# Patient Record
Sex: Male | Born: 1970 | Race: White | Hispanic: No | Marital: Single | Smoking: Never smoker
Health system: Southern US, Community
[De-identification: ages and names within clinical notes are randomized; demographics above are authoritative.]

## PROBLEM LIST (undated history)

## (undated) DIAGNOSIS — K219 Gastro-esophageal reflux disease without esophagitis: Secondary | ICD-10-CM

## (undated) DIAGNOSIS — I10 Essential (primary) hypertension: Secondary | ICD-10-CM

## (undated) DIAGNOSIS — Z1612 Extended spectrum beta lactamase (ESBL) resistance: Secondary | ICD-10-CM

## (undated) DIAGNOSIS — K5792 Diverticulitis of intestine, part unspecified, without perforation or abscess without bleeding: Secondary | ICD-10-CM

## (undated) DIAGNOSIS — M109 Gout, unspecified: Secondary | ICD-10-CM

## (undated) DIAGNOSIS — E785 Hyperlipidemia, unspecified: Secondary | ICD-10-CM

## (undated) DIAGNOSIS — A499 Bacterial infection, unspecified: Secondary | ICD-10-CM

## (undated) DIAGNOSIS — E119 Type 2 diabetes mellitus without complications: Secondary | ICD-10-CM

## (undated) HISTORY — PX: KNEE SURGERY: SHX244

---

## 2016-04-15 ENCOUNTER — Inpatient Hospital Stay (HOSPITAL_COMMUNITY)
Admission: AD | Admit: 2016-04-15 | Discharge: 2016-04-21 | DRG: 392 | Disposition: A | Discharge status: Home / Self Care | Payer: BLUE CROSS/BLUE SHIELD | Source: Other Acute Inpatient Hospital | Attending: General Surgery | Admitting: General Surgery

## 2016-04-15 DIAGNOSIS — A047 Enterocolitis due to Clostridium difficile: Secondary | ICD-10-CM | POA: Diagnosis present

## 2016-04-15 DIAGNOSIS — K6389 Other specified diseases of intestine: Secondary | ICD-10-CM

## 2016-04-15 DIAGNOSIS — K219 Gastro-esophageal reflux disease without esophagitis: Secondary | ICD-10-CM | POA: Diagnosis present

## 2016-04-15 DIAGNOSIS — K5720 Diverticulitis of large intestine with perforation and abscess without bleeding: Secondary | ICD-10-CM | POA: Diagnosis present

## 2016-04-15 DIAGNOSIS — Z885 Allergy status to narcotic agent status: Secondary | ICD-10-CM | POA: Diagnosis not present

## 2016-04-15 DIAGNOSIS — R109 Unspecified abdominal pain: Secondary | ICD-10-CM | POA: Diagnosis present

## 2016-04-15 HISTORY — DX: Type 2 diabetes mellitus without complications: E11.9

## 2016-04-15 HISTORY — DX: Extended spectrum beta lactamase (ESBL) resistance: Z16.12

## 2016-04-15 HISTORY — DX: Essential (primary) hypertension: I10

## 2016-04-15 HISTORY — DX: Gastro-esophageal reflux disease without esophagitis: K21.9

## 2016-04-15 HISTORY — DX: Gout, unspecified: M10.9

## 2016-04-15 HISTORY — DX: Hyperlipidemia, unspecified: E78.5

## 2016-04-15 HISTORY — DX: Diverticulitis of intestine, part unspecified, without perforation or abscess without bleeding: K57.92

## 2016-04-15 HISTORY — DX: Bacterial infection, unspecified: A49.9

## 2016-04-15 LAB — GLUCOSE, CAPILLARY: Glucose-Capillary: 100 mg/dL — ABNORMAL HIGH (ref 65–99)

## 2016-04-15 LAB — CBC
HCT: 37.9 % — ABNORMAL LOW (ref 39.0–52.0)
Hemoglobin: 12.4 g/dL — ABNORMAL LOW (ref 13.0–17.0)
MCH: 29.5 pg (ref 26.0–34.0)
MCHC: 32.7 g/dL (ref 30.0–36.0)
MCV: 90.2 fL (ref 78.0–100.0)
Platelets: 303 10*3/uL (ref 150–400)
RBC: 4.2 MIL/uL — ABNORMAL LOW (ref 4.22–5.81)
RDW: 14.2 % (ref 11.5–15.5)
WBC: 19.4 10*3/uL — ABNORMAL HIGH (ref 4.0–10.5)

## 2016-04-15 LAB — BASIC METABOLIC PANEL
Anion gap: 9 (ref 5–15)
BUN: 16 mg/dL (ref 6–20)
CO2: 22 mmol/L (ref 22–32)
Calcium: 9 mg/dL (ref 8.9–10.3)
Chloride: 105 mmol/L (ref 101–111)
Creatinine, Ser: 1.26 mg/dL — ABNORMAL HIGH (ref 0.61–1.24)
GFR calc Af Amer: >60 mL/min (ref >60)
GFR calc non Af Amer: >60 mL/min (ref >60)
Glucose, Bld: 104 mg/dL — ABNORMAL HIGH (ref 65–99)
Potassium: 4.1 mmol/L (ref 3.5–5.1)
Sodium: 136 mmol/L (ref 135–145)

## 2016-04-15 LAB — PROTIME-INR
INR: 1.26
Prothrombin Time: 15.8 seconds — ABNORMAL HIGH (ref 11.4–15.2)

## 2016-04-15 LAB — APTT: APTT: 30 s (ref 24–36)

## 2016-04-15 MED ORDER — METRONIDAZOLE IN NACL 5-0.79 MG/ML-% IV SOLN
500.0000 mg | Freq: Three times a day (TID) | INTRAVENOUS | Status: DC
  Administered 2016-04-15 – 2016-04-19 (×11): 500 mg via INTRAVENOUS
  Filled 2016-04-15 (×13): qty 100

## 2016-04-15 MED ORDER — ONDANSETRON HCL 4 MG/2ML IJ SOLN
4.0000 mg | Freq: Four times a day (QID) | INTRAMUSCULAR | Status: DC | PRN
  Administered 2016-04-17 – 2016-04-18 (×3): 4 mg via INTRAVENOUS
  Filled 2016-04-15 (×3): qty 2

## 2016-04-15 MED ORDER — DIPHENHYDRAMINE HCL 50 MG/ML IJ SOLN: 12.5000 mg | Freq: Four times a day (QID) | INTRAMUSCULAR | Status: DC | PRN

## 2016-04-15 MED ORDER — MORPHINE SULFATE (PF) 2 MG/ML IV SOLN
2.0000 mg | INTRAVENOUS | Status: DC | PRN
  Administered 2016-04-16: 2 mg via INTRAVENOUS
  Filled 2016-04-15: qty 1

## 2016-04-15 MED ORDER — KCL IN DEXTROSE-NACL 20-5-0.45 MEQ/L-%-% IV SOLN
INTRAVENOUS | Status: DC
  Administered 2016-04-15 – 2016-04-20 (×9): via INTRAVENOUS
  Filled 2016-04-15 (×10): qty 1000

## 2016-04-15 MED ORDER — SIMETHICONE 80 MG PO CHEW: 40.0000 mg | CHEWABLE_TABLET | Freq: Four times a day (QID) | ORAL | Status: DC | PRN

## 2016-04-15 MED ORDER — ACETAMINOPHEN 325 MG PO TABS
650.0000 mg | ORAL_TABLET | Freq: Four times a day (QID) | ORAL | Status: DC | PRN
  Administered 2016-04-16 – 2016-04-18 (×4): 650 mg via ORAL
  Filled 2016-04-15 (×4): qty 2

## 2016-04-15 MED ORDER — OXYCODONE HCL 5 MG PO TABS
5.0000 mg | ORAL_TABLET | ORAL | Status: DC | PRN
  Administered 2016-04-16: 10 mg via ORAL
  Filled 2016-04-15: qty 2

## 2016-04-15 MED ORDER — DEXTROSE 5 % IV SOLN
2.0000 g | INTRAVENOUS | Status: DC
  Filled 2016-04-15: qty 2

## 2016-04-15 MED ORDER — ACETAMINOPHEN 650 MG RE SUPP: 650.0000 mg | Freq: Four times a day (QID) | RECTAL | Status: DC | PRN

## 2016-04-15 MED ORDER — ONDANSETRON 4 MG PO TBDP
4.0000 mg | ORAL_TABLET | Freq: Four times a day (QID) | ORAL | Status: DC | PRN
  Administered 2016-04-19: 4 mg via ORAL
  Filled 2016-04-15: qty 1

## 2016-04-15 MED ORDER — ORAL CARE MOUTH RINSE
15.0000 mL | Freq: Two times a day (BID) | OROMUCOSAL | Status: DC
  Administered 2016-04-16 – 2016-04-20 (×7): 15 mL via OROMUCOSAL

## 2016-04-15 MED ORDER — PIPERACILLIN-TAZOBACTAM 3.375 G IVPB
3.3750 g | Freq: Three times a day (TID) | INTRAVENOUS | Status: DC
  Administered 2016-04-15 – 2016-04-19 (×11): 3.375 g via INTRAVENOUS
  Filled 2016-04-15 (×13): qty 50

## 2016-04-15 MED ORDER — DIPHENHYDRAMINE HCL 12.5 MG/5ML PO ELIX: 12.5000 mg | ORAL_SOLUTION | Freq: Four times a day (QID) | ORAL | Status: DC | PRN

## 2016-04-15 NOTE — H&P (Signed)
Norman Reyes is an 45 y.o. male.   Chief Complaint: abdominal pain HPI: 45 yo male with multiple bouts of diverticulitis presented 10 days ago with abdominal pain found to have diverticulitis with 3cm abscess. He was admitted treated with antibiotics, slowly improved, and was discharge, his hospitalization was complicated by c difficile colitis treated with PO vanc and then flagyl. He now has recurrence of pain and nausea.  No past medical history on file.  No past surgical history on file.  No family history on file. Social History:  has no tobacco, alcohol, and drug history on file.  Allergies:  Allergies  Allergen Reactions  . Codeine Other (See Comments)    No prescriptions prior to admission.    Results for orders placed or performed during the hospital encounter of 04/15/16 (from the past 48 hour(s))  Glucose, capillary     Status: Abnormal   Collection Time: 04/15/16  4:40 PM  Result Value Ref Range   Glucose-Capillary 100 (H) 65 - 99 mg/dL   No results found.  Review of Systems  Constitutional: Negative for chills and fever.  HENT: Negative for hearing loss.   Eyes: Negative for blurred vision and double vision.  Respiratory: Negative for cough and hemoptysis.   Cardiovascular: Negative for chest pain and palpitations.  Gastrointestinal: Positive for abdominal pain and nausea. Negative for vomiting.  Genitourinary: Negative for dysuria and urgency.  Musculoskeletal: Negative for myalgias and neck pain.  Skin: Negative for itching and rash.  Neurological: Negative for dizziness, tingling and headaches.  Endo/Heme/Allergies: Does not bruise/bleed easily.  Psychiatric/Behavioral: Negative for depression and suicidal ideas.    Blood pressure 113/76, pulse 98, temperature 98.7 F (37.1 C), temperature source Oral, SpO2 97 %. Physical Exam  Nursing note and vitals reviewed. Constitutional: He is oriented to person, place, and time. He appears well-developed and  well-nourished.  HENT:  Head: Normocephalic and atraumatic.  Eyes: Conjunctivae and EOM are normal. No scleral icterus.  Neck: Normal range of motion. Neck supple.  Cardiovascular: Normal rate and regular rhythm.   Respiratory: Effort normal and breath sounds normal. He has no wheezes. He has no rales. He exhibits no tenderness.  GI: Soft. He exhibits no distension. There is tenderness in the left lower quadrant. There is no rebound.  Musculoskeletal: Normal range of motion. He exhibits no edema.  Neurological: He is alert and oriented to person, place, and time.  Skin: Skin is warm and dry.  Psychiatric: He has a normal mood and affect. His behavior is normal.     OSH labs WBC 18.7 Hgb 13.4 Plt 326  Cr 1.2   Assessment/Plan 45 yo male with recurrent sigmoid diverticulitis with 7cm abscess. He has had multiple bouts in the past, though has never undergone colonoscopy -NPO -IV zosyn/flagyl -IR for perc drain -IV resuscitation -we discussed due to the recurrent nature he likely would benefit from a resection type operation. At this time we will try drainage to get to an elective and single stage operation, though we did discuss the possible need for hartman's procedure  [name], MD 04/15/2016, 4:54 PM

## 2016-04-15 NOTE — Progress Notes (Signed)
Pt admitted to [facility] with family. Dr Sheliah HatchKinsinger texted of pt arrival.

## 2016-04-16 ENCOUNTER — Encounter (HOSPITAL_COMMUNITY): Payer: Self-pay | Admitting: Radiology

## 2016-04-16 ENCOUNTER — Inpatient Hospital Stay (HOSPITAL_COMMUNITY): Payer: BLUE CROSS/BLUE SHIELD

## 2016-04-16 LAB — BASIC METABOLIC PANEL
ANION GAP: 6 (ref 5–15)
BUN: 15 mg/dL (ref 6–20)
CHLORIDE: 104 mmol/L (ref 101–111)
CO2: 26 mmol/L (ref 22–32)
Calcium: 8.6 mg/dL — ABNORMAL LOW (ref 8.9–10.3)
Creatinine, Ser: 1.31 mg/dL — ABNORMAL HIGH (ref 0.61–1.24)
GFR calc Af Amer: >60 mL/min (ref >60)
GFR calc non Af Amer: >60 mL/min (ref >60)
GLUCOSE: 125 mg/dL — AB (ref 65–99)
POTASSIUM: 3.6 mmol/L (ref 3.5–5.1)
Sodium: 136 mmol/L (ref 135–145)

## 2016-04-16 LAB — CBC
HEMATOCRIT: 37.5 % — AB (ref 39.0–52.0)
HEMOGLOBIN: 12 g/dL — AB (ref 13.0–17.0)
MCH: 29.1 pg (ref 26.0–34.0)
MCHC: 32 g/dL (ref 30.0–36.0)
MCV: 91 fL (ref 78.0–100.0)
Platelets: 307 10*3/uL (ref 150–400)
RBC: 4.12 MIL/uL — ABNORMAL LOW (ref 4.22–5.81)
RDW: 14.1 % (ref 11.5–15.5)
WBC: 17.9 10*3/uL — ABNORMAL HIGH (ref 4.0–10.5)

## 2016-04-16 MED ORDER — LIDOCAINE HCL 1 % IJ SOLN
INTRAMUSCULAR | Status: AC
  Filled 2016-04-16: qty 20

## 2016-04-16 MED ORDER — LORAZEPAM 2 MG/ML IJ SOLN
INTRAMUSCULAR | Status: AC | PRN
  Administered 2016-04-16: 1 mg via INTRAVENOUS

## 2016-04-16 MED ORDER — MIDAZOLAM HCL 2 MG/2ML IJ SOLN
INTRAMUSCULAR | Status: AC
  Filled 2016-04-16: qty 2

## 2016-04-16 MED ORDER — FENTANYL CITRATE (PF) 100 MCG/2ML IJ SOLN
INTRAMUSCULAR | Status: AC | PRN
  Administered 2016-04-16 (×3): 50 ug via INTRAVENOUS

## 2016-04-16 MED ORDER — MIDAZOLAM HCL 2 MG/2ML IJ SOLN
INTRAMUSCULAR | Status: AC | PRN
  Administered 2016-04-16 (×2): 1 mg via INTRAVENOUS

## 2016-04-16 MED ORDER — FENTANYL CITRATE (PF) 100 MCG/2ML IJ SOLN
INTRAMUSCULAR | Status: AC
  Filled 2016-04-16: qty 2

## 2016-04-16 NOTE — Progress Notes (Signed)
Feels slightly better this Am, still with pain.   Vitals:   04/16/16 0205 04/16/16 0543  BP: 122/73 (!) 109/56  Pulse: 96 79  Resp: 18 18  Temp: 98.5 F (36.9 C) 97.8 F (36.6 C)   CBC Latest Ref Rng & Units 04/16/2016 04/15/2016  WBC 4.0 - 10.5 K/uL 17.9(H) 19.4(H)  Hemoglobin 13.0 - 17.0 g/dL 12.0(L) 12.4(L)  Hematocrit 39.0 - 52.0 % 37.5(L) 37.9(L)  Platelets 150 - 400 K/uL 307 303   BMP Latest Ref Rng & Units 04/16/2016 04/15/2016  Glucose 65 - 99 mg/dL 161(W125(H) 960(A104(H)  BUN 6 - 20 mg/dL 15 16  Creatinine 5.400.61 - 1.24 mg/dL 9.81(X1.31(H) 9.14(N1.26(H)  Sodium 135 - 145 mmol/L 136 136  Potassium 3.5 - 5.1 mmol/L 3.6 4.1  Chloride 101 - 111 mmol/L 104 105  CO2 22 - 32 mmol/L 26 22  Calcium 8.9 - 10.3 mg/dL 8.2(N8.6(L) 9.0    Intake/Output Summary (Last 24 hours) at 04/16/16 0851 Last data filed at 04/16/16 0600  Gross per 24 hour  Intake             1123 ml  Output              500 ml  Net              623 ml    Aox3, no distress Unlabored respirations RRR Abdomen soft, nondistended, mild LLQ ttp no peritoneal signs ExtWWP  A/P: Diverticulitis with abscess -IR drain today -Continue NPO, ABX, IVF until WBC normalizes and pain resolves

## 2016-04-16 NOTE — Procedures (Signed)
CT placement 46F L pelvic abscess drain 30ml purulent aspirate for GS, C&S No complication No blood loss. See complete dictation in [facility].

## 2016-04-16 NOTE — Sedation Documentation (Signed)
Patient denies pain and is resting comfortably.  

## 2016-04-16 NOTE — Plan of Care (Signed)
Problem: Nutrition: Goal: Adequate nutrition will be maintained Outcome: Progressing Pt on CL diet following drain placement, pt understands.

## 2016-04-16 NOTE — Progress Notes (Signed)
Paged IRadiology through answering service to find out about drain placement.

## 2016-04-16 NOTE — Progress Notes (Signed)
IR consulted for percutaneous drainage of persistent diverticular abscess Chart, imaging, labs, meds reviewed. Amenable to perc drainage. BP 120/75 (BP Location: Left Arm)   Pulse 87   Temp 98.6 F (37 C) (Oral)   Resp 18   SpO2 97%  Lungs: CTA Heart: reg Abd: tender low abd  Diverticulitis with abscess For CT guided perc drainage. Risks and Benefits discussed with the patient including bleeding, infection, damage to adjacent structures, bowel perforation/fistula connection, and sepsis. All of the patient's questions were answered, patient is agreeable to proceed. Consent signed and in chart.  Brayton ElKevin Lamaria Hildebrandt PA-C Interventional Radiology 04/16/2016 11:56 AM

## 2016-04-17 LAB — CBC
HCT: 37 % — ABNORMAL LOW (ref 39.0–52.0)
Hemoglobin: 12 g/dL — ABNORMAL LOW (ref 13.0–17.0)
MCH: 29.3 pg (ref 26.0–34.0)
MCHC: 32.4 g/dL (ref 30.0–36.0)
MCV: 90.5 fL (ref 78.0–100.0)
PLATELETS: 323 10*3/uL (ref 150–400)
RBC: 4.09 MIL/uL — AB (ref 4.22–5.81)
RDW: 13.8 % (ref 11.5–15.5)
WBC: 13.5 10*3/uL — AB (ref 4.0–10.5)

## 2016-04-17 MED ORDER — METHOCARBAMOL 750 MG PO TABS: 750.0000 mg | ORAL_TABLET | Freq: Four times a day (QID) | ORAL | Status: DC | PRN

## 2016-04-17 NOTE — Progress Notes (Signed)
Supervising Physician: Irish Lack  Patient Status:  Inpatient   Subjective: S/p LLQ perc drain 8/27 for diverticular abscess Feeling some nausea this am.  Sore at drain site as expected  Allergies: Codeine  Medications:  Current Facility-Administered Medications:  .  acetaminophen (TYLENOL) tablet 650 mg, 650 mg, Oral, Q6H PRN, 650 mg at 04/17/16 0231 **OR** acetaminophen (TYLENOL) suppository 650 mg, 650 mg, Rectal, Q6H PRN, De Blanch Kinsinger, MD .  dextrose 5 % and 0.45 % NaCl with KCl 20 mEq/L infusion, , Intravenous, Continuous, Rodman Pickle, MD, Last Rate: 75 mL/hr at 04/17/16 0548 .  diphenhydrAMINE (BENADRYL) 12.5 MG/5ML elixir 12.5 mg, 12.5 mg, Oral, Q6H PRN **OR** diphenhydrAMINE (BENADRYL) injection 12.5 mg, 12.5 mg, Intravenous, Q6H PRN, Rodman Pickle, MD .  MEDLINE mouth rinse, 15 mL, Mouth Rinse, BID, Rodman Pickle, MD, 15 mL at 04/17/16 1003 .  [DISCONTINUED] cefTRIAXone (ROCEPHIN) 2 g in dextrose 5 % 50 mL IVPB, 2 g, Intravenous, Q24H **AND** metroNIDAZOLE (FLAGYL) IVPB 500 mg, 500 mg, Intravenous, Q8H, Rodman Pickle, MD, 500 mg at 04/17/16 9604 .  morphine 2 MG/ML injection 2-4 mg, 2-4 mg, Intravenous, Q1H PRN, Rodman Pickle, MD, 2 mg at 04/16/16 2343 .  ondansetron (ZOFRAN-ODT) disintegrating tablet 4 mg, 4 mg, Oral, Q6H PRN **OR** ondansetron (ZOFRAN) injection 4 mg, 4 mg, Intravenous, Q6H PRN, Rodman Pickle, MD, 4 mg at 04/17/16 1001 .  oxyCODONE (Oxy IR/ROXICODONE) immediate release tablet 5-10 mg, 5-10 mg, Oral, Q4H PRN, Rodman Pickle, MD, 10 mg at 04/16/16 1351 .  piperacillin-tazobactam (ZOSYN) IVPB 3.375 g, 3.375 g, Intravenous, Q8H, Joaquim Nam, RPH, 3.375 g at 04/17/16 0831 .  simethicone (MYLICON) chewable tablet 40 mg, 40 mg, Oral, Q6H PRN, Rodman Pickle, MD    Vital Signs: BP (!) 96/57 (BP Location: Right Arm)   Pulse 64   Temp 97.5 F (36.4 C) (Oral)   Resp 16   SpO2 97%    Physical Exam LLQ drain intact, site clean Purulent output.  Imaging: Ct Image Guided Drainage By Percutaneous Catheter  Result Date: 04/16/2016 CLINICAL DATA:  Progression of diverticular abscess. EXAM: CT GUIDED DRAINAGE OF LEFT PELVIC ABSCESS ANESTHESIA/SEDATION: Intravenous Fentanyl and Versed were administered as conscious sedation during continuous monitoring of the patient's level of consciousness and physiological / cardiorespiratory status by the radiology RN, with a total moderate sedation time of 7 minutes. PROCEDURE: The procedure, risks, benefits, and alternatives were explained to the patient. Questions regarding the procedure were encouraged and answered. The patient understands and consents to the procedure. Select axial scans through the pelvis were obtained. The left intraperitoneal abscess was localized and an appropriate skin entry site was determined and marked. The operative field was prepped with chlorhexidinein a sterile fashion, and a sterile drape was applied covering the operative field. A sterile gown and sterile gloves were used for the procedure. Local anesthesia was provided with 1% Lidocaine. Under CT fluoroscopic guidance, an 18 gauge trocar needle was advanced into the collection. Purulent material could be aspirated. An Amplatz guidewire advanced easily within the collection, its position confirmed on CT. Tract dilated to facilitate placement of a 12 French pigtail drain catheter, formed centrally within the collection. 30 mL of purulent aspirate was sent for Gram stain, culture and sensitivity. Catheter secured externally with 0 Prolene suture and StatLock and placed to a gravity drain bag. The patient tolerated the procedure well. COMPLICATIONS: None immediate FINDINGS: Loculated gas and fluid collection in the  left pelvis was again localized. 12French drain catheter placed under CT fluoroscopic guidance. A sample of the aspirate sent for Gram stain and culture.  IMPRESSION: 1. Technically successful CT-guided left pelvic abscess drain catheter placement. Electronically Signed   By: Corlis Leak  Hassell M.D.   On: 04/16/2016 13:20    Labs:  CBC:  Recent Labs  04/15/16 1642 04/16/16 0258  WBC 19.4* 17.9*  HGB 12.4* 12.0*  HCT 37.9* 37.5*  PLT 303 307    COAGS:  Recent Labs  04/15/16 1642  INR 1.26  APTT 30    BMP:  Recent Labs  04/15/16 1642 04/16/16 0258  NA 136 136  K 4.1 3.6  CL 105 104  CO2 22 26  GLUCOSE 104* 125*  BUN 16 15  CALCIUM 9.0 8.6*  CREATININE 1.26* 1.31*  GFRNONAA >60 >60  GFRAA >60 >60    LIVER FUNCTION TESTS: No results for input(s): BILITOT, AST, ALT, ALKPHOS, PROT, ALBUMIN in the last 8760 hours.  Assessment and Plan: LLQ diverticulitis with abscess S/p perc drain Check Cx Drain care/flushes Other plans per [facility]CS  Electronically Signed: Brayton ElBRUNING, Mir Fullilove 04/17/2016, 10:14 AM   I spent a total of 15 Minutes at the the patient's bedside AND on the patient's hospital floor or unit, greater than 50% of which was counseling/coordinating care for LLQ abscess drain

## 2016-04-17 NOTE — Progress Notes (Signed)
[facility] Progress Note     Subjective: Parents in the room. LLQ abdominal soreness after drain placement. Tried taking morphine caused him nausea. Has not had much to drink, does not have an appetite. +BM yesterday that was loose - denies melena, hematochezia, and purulence. Only ambulating to bathroom. Denies fevers/chills.  Drain output ~ 150-200cc/24h   Objective: Vital signs in last 24 hours: Temp:  [97.5 F (36.4 C)-98.8 F (37.1 C)] 97.5 F (36.4 C) (08/28 0606) Pulse Rate:  [64-89] 64 (08/28 0606) Resp:  [13-20] 16 (08/28 0606) BP: (96-131)/(57-83) 96/57 (08/28 0606) SpO2:  [95 %-100 %] 97 % (08/28 0606) Last BM Date: 04/13/16  Intake/Output from previous day: 08/27 0701 - 08/28 0700 In: 2060.4 [P.O.:120; I.V.:1535.4; IV Piggyback:400] Out: 1030 [Urine:900; Drains:130] Intake/Output this shift: Total I/O In: 120 [P.O.:120] Out: -   PE: Gen:  Alert, NAD, pleasant Card:  RRR, no M/G/R  Pulm:  CTA, no W/R/R Abd: Soft, TTP of LLQ, ND, +BS, LLQ drain with 20-30 cc purulent drainage  Lab Results:   Recent Labs  04/15/16 1642 04/16/16 0258  WBC 19.4* 17.9*  HGB 12.4* 12.0*  HCT 37.9* 37.5*  PLT 303 307   BMET  Recent Labs  04/15/16 1642 04/16/16 0258  NA 136 136  K 4.1 3.6  CL 105 104  CO2 22 26  GLUCOSE 104* 125*  BUN 16 15  CREATININE 1.26* 1.31*  CALCIUM 9.0 8.6*   PT/INR  Recent Labs  04/15/16 1642  LABPROT 15.8*  INR 1.26   CMP     Component Value Date/Time   NA 136 04/16/2016 0258   K 3.6 04/16/2016 0258   CL 104 04/16/2016 0258   CO2 26 04/16/2016 0258   GLUCOSE 125 (H) 04/16/2016 0258   BUN 15 04/16/2016 0258   CREATININE 1.31 (H) 04/16/2016 0258   CALCIUM 8.6 (L) 04/16/2016 0258   GFRNONAA >60 04/16/2016 0258   GFRAA >60 04/16/2016 0258   Lipase  No results found for: LIPASE  Studies/Results: Ct Image Guided Drainage By Percutaneous Catheter  Result Date: 04/16/2016 CLINICAL DATA:  Progression of  diverticular abscess. EXAM: CT GUIDED DRAINAGE OF LEFT PELVIC ABSCESS ANESTHESIA/SEDATION: Intravenous Fentanyl and Versed were administered as conscious sedation during continuous monitoring of the patient's level of consciousness and physiological / cardiorespiratory status by the radiology RN, with a total moderate sedation time of 7 minutes. PROCEDURE: The procedure, risks, benefits, and alternatives were explained to the patient. Questions regarding the procedure were encouraged and answered. The patient understands and consents to the procedure. Select axial scans through the pelvis were obtained. The left intraperitoneal abscess was localized and an appropriate skin entry site was determined and marked. The operative field was prepped with chlorhexidinein a sterile fashion, and a sterile drape was applied covering the operative field. A sterile gown and sterile gloves were used for the procedure. Local anesthesia was provided with 1% Lidocaine. Under CT fluoroscopic guidance, an 18 gauge trocar needle was advanced into the collection. Purulent material could be aspirated. An Amplatz guidewire advanced easily within the collection, its position confirmed on CT. Tract dilated to facilitate placement of a 12 French pigtail drain catheter, formed centrally within the collection. 30 mL of purulent aspirate was sent for Gram stain, culture and sensitivity. Catheter secured externally with 0 Prolene suture and StatLock and placed to a gravity drain bag. The patient tolerated the procedure well. COMPLICATIONS: None immediate FINDINGS: Loculated gas and fluid collection in the left pelvis was again  localized. 12French drain catheter placed under CT fluoroscopic guidance. A sample of the aspirate sent for Gram stain and culture. IMPRESSION: 1. Technically successful CT-guided left pelvic abscess drain catheter placement. Electronically Signed   By: Corlis Leak  Hassell M.D.   On: 04/16/2016 13:20    Anti-infectives: Anti-infectives    Start     Dose/Rate Route Frequency Ordered Stop   04/15/16 1800  cefTRIAXone (ROCEPHIN) 2 g in dextrose 5 % 50 mL IVPB  Status:  Discontinued     2 g 100 mL/hr over 30 Minutes Intravenous Every 24 hours 04/15/16 1556 04/15/16 1650   04/15/16 1700  piperacillin-tazobactam (ZOSYN) IVPB 3.375 g     3.375 g 12.5 mL/hr over 240 Minutes Intravenous Every 8 hours 04/15/16 1655     04/15/16 1600  metroNIDAZOLE (FLAGYL) IVPB 500 mg     500 mg 100 mL/hr over 60 Minutes Intravenous Every 8 hours 04/15/16 1556       Assessment/Plan Diverticulitis, recurrent, will abscess formation S/p 12 F L pelvic abscess drain 04/16/16  - 30ml purulent aspirate for GS, C&S (Currently growing few GNR) - WBC 17.9 yesterday, repeat CBC ordered  FEN: clear liquids DVT Proph: ID: Zosyn and Flagyl 8/26 >>  Plan: continue clears, monitor drain OP, PO pain control  Add robaxin    LOS: 2 days    Adam PhenixElizabeth S Monnie Gudgel , [facility] 04/17/2016, 11:54 AM Pager: [phone] Consults: [phone] Mon-Fri 7:00 am-4:30 pm Sat-Sun 7:00 am-11:30 am

## 2016-04-18 MED ORDER — FAMOTIDINE 20 MG PO TABS
20.0000 mg | ORAL_TABLET | Freq: Two times a day (BID) | ORAL | Status: DC
  Administered 2016-04-18 – 2016-04-21 (×7): 20 mg via ORAL
  Filled 2016-04-18 (×8): qty 1

## 2016-04-18 MED ORDER — ENOXAPARIN SODIUM 40 MG/0.4ML ~~LOC~~ SOLN
40.0000 mg | SUBCUTANEOUS | Status: DC
  Administered 2016-04-18 – 2016-04-19 (×2): 40 mg via SUBCUTANEOUS
  Filled 2016-04-18 (×3): qty 0.4

## 2016-04-18 NOTE — Progress Notes (Signed)
[facility] Progress Note     Subjective: NAE overnight. LLQ pain improving but still sore. C/o acid reflux, takes 150mg  zantac daily at home. 3 loose, brown BMs yesterday. Has only been drinking water. Ambulated in his room yesterday, not in the hall.   LLQ drain ~130cc/24h, purulent   Objective: Vital signs in last 24 hours: Temp:  [97.8 F (36.6 C)-98.3 F (36.8 C)] 97.8 F (36.6 C) (08/29 0420) Pulse Rate:  [76-82] 82 (08/29 0420) Resp:  [16-19] 19 (08/29 0420) BP: (112-124)/(74-79) 119/79 (08/29 0420) SpO2:  [99 %] 99 % (08/29 0420) Last BM Date: 04/17/16  Intake/Output from previous day: 08/28 0701 - 08/29 0700 In: 1770 [P.O.:360; I.V.:1255; IV Piggyback:150] Out: 1070 [Urine:1050; Drains:20] Intake/Output this shift: Total I/O In: -  Out: 200 [Urine:200]  PE: Gen:  Alert, NAD, pleasant Card:  RRR, no M/G/R  Pulm:  CTA, no W/R/R Abd: Soft, TTP of LLQ, ND, +BS, LLQ drain with ~10 cc purulent drainage  Lab Results:   Recent Labs  04/16/16 0258 04/17/16 1221  WBC 17.9* 13.5*  HGB 12.0* 12.0*  HCT 37.5* 37.0*  PLT 307 323   BMET  Recent Labs  04/15/16 1642 04/16/16 0258  NA 136 136  K 4.1 3.6  CL 105 104  CO2 22 26  GLUCOSE 104* 125*  BUN 16 15  CREATININE 1.26* 1.31*  CALCIUM 9.0 8.6*   PT/INR  Recent Labs  04/15/16 1642  LABPROT 15.8*  INR 1.26   CMP     Component Value Date/Time   NA 136 04/16/2016 0258   K 3.6 04/16/2016 0258   CL 104 04/16/2016 0258   CO2 26 04/16/2016 0258   GLUCOSE 125 (H) 04/16/2016 0258   BUN 15 04/16/2016 0258   CREATININE 1.31 (H) 04/16/2016 0258   CALCIUM 8.6 (L) 04/16/2016 0258   GFRNONAA >60 04/16/2016 0258   GFRAA >60 04/16/2016 0258   Lipase  No results found for: LIPASE  Studies/Results: Ct Image Guided Drainage By Percutaneous Catheter  Result Date: 04/16/2016 CLINICAL DATA:  Progression of diverticular abscess. EXAM: CT GUIDED DRAINAGE OF LEFT PELVIC ABSCESS  ANESTHESIA/SEDATION: Intravenous Fentanyl and Versed were administered as conscious sedation during continuous monitoring of the patient's level of consciousness and physiological / cardiorespiratory status by the radiology RN, with a total moderate sedation time of 7 minutes. PROCEDURE: The procedure, risks, benefits, and alternatives were explained to the patient. Questions regarding the procedure were encouraged and answered. The patient understands and consents to the procedure. Select axial scans through the pelvis were obtained. The left intraperitoneal abscess was localized and an appropriate skin entry site was determined and marked. The operative field was prepped with chlorhexidinein a sterile fashion, and a sterile drape was applied covering the operative field. A sterile gown and sterile gloves were used for the procedure. Local anesthesia was provided with 1% Lidocaine. Under CT fluoroscopic guidance, an 18 gauge trocar needle was advanced into the collection. Purulent material could be aspirated. An Amplatz guidewire advanced easily within the collection, its position confirmed on CT. Tract dilated to facilitate placement of a 12 French pigtail drain catheter, formed centrally within the collection. 30 mL of purulent aspirate was sent for Gram stain, culture and sensitivity. Catheter secured externally with 0 Prolene suture and StatLock and placed to a gravity drain bag. The patient tolerated the procedure well. COMPLICATIONS: None immediate FINDINGS: Loculated gas and fluid collection in the left pelvis was again localized. 12French drain catheter placed under CT fluoroscopic  guidance. A sample of the aspirate sent for Gram stain and culture. IMPRESSION: 1. Technically successful CT-guided left pelvic abscess drain catheter placement. Electronically Signed   By: Corlis Leak M.D.   On: 04/16/2016 13:20    Anti-infectives: Anti-infectives    Start     Dose/Rate Route Frequency Ordered Stop    04/15/16 1800  cefTRIAXone (ROCEPHIN) 2 g in dextrose 5 % 50 mL IVPB  Status:  Discontinued     2 g 100 mL/hr over 30 Minutes Intravenous Every 24 hours 04/15/16 1556 04/15/16 1650   04/15/16 1700  piperacillin-tazobactam (ZOSYN) IVPB 3.375 g     3.375 g 12.5 mL/hr over 240 Minutes Intravenous Every 8 hours 04/15/16 1655     04/15/16 1600  metroNIDAZOLE (FLAGYL) IVPB 500 mg     500 mg 100 mL/hr over 60 Minutes Intravenous Every 8 hours 04/15/16 1556       Assessment/Plan Diverticulitis, recurrent, will abscess formation S/p 12 F L pelvic abscess drain 04/16/16  - 30ml purulent aspirate for GS, C&S (Currently growing few GNR, rare gram variable rod) - WBC trending down: 13.5 - check BMP and CBC in AM   GERD: Pepcid 20 mg BID   FEN: clear liquids DVT Proph: start lovenox, SCD's ID: Zosyn and Flagyl 8/26 >>  Plan: continue clears, monitor drain OP, PO pain control   Start oral abx in 24-48h   LOS: 3 days    Adam Phenix , [facility] 04/18/2016, 8:00 AM Pager: [phone] Consults: [phone] Mon-Fri 7:00 am-4:30 pm Sat-Sun 7:00 am-11:30 am

## 2016-04-18 NOTE — Progress Notes (Signed)
Patient ID: Norman HubertMarcus Arko, male   DOB: 03/25/1971, 45 y.o.   MRN: [id number]    Referring Physician(s): Dr. Feliciana RossettiLuke Kinsinger  Supervising Physician: Simonne ComeWatts, John  Patient Status: inpt  Chief Complaint: Diverticular abscess  Subjective: Patient feeling ok today.  Tolerating some clear liquids.  Less sore around drain site today.  Allergies: Codeine  Medications: Prior to Admission medications   Medication Sig Start Date End Date Taking? Authorizing Provider  acetaminophen (TYLENOL) 500 MG tablet Take 500 mg by mouth every 8 (eight) hours as needed (pain).   Yes Historical Provider, MD  allopurinol (ZYLOPRIM) 300 MG tablet Take 300 mg by mouth at bedtime.   Yes Historical Provider, MD  ibuprofen (ADVIL,MOTRIN) 200 MG tablet Take 600 mg by mouth every 8 (eight) hours as needed (pain).   Yes Historical Provider, MD  levofloxacin (LEVAQUIN) 750 MG tablet Take 750 mg by mouth every evening. 10 day course started 04/13/16   Yes Historical Provider, MD  losartan (COZAAR) 100 MG tablet Take 100 mg by mouth daily.   Yes Historical Provider, MD  metFORMIN (GLUCOPHAGE-XR) 500 MG 24 hr tablet Take 500 mg by mouth 2 (two) times daily.   Yes Historical Provider, MD  metroNIDAZOLE (FLAGYL) 500 MG tablet Take 500 mg by mouth 3 (three) times daily. 10 day course started 04/12/16   Yes Historical Provider, MD  ranitidine (ZANTAC) 150 MG tablet Take 150 mg by mouth daily.   Yes Historical Provider, MD  simvastatin (ZOCOR) 40 MG tablet Take 40 mg by mouth at bedtime.   Yes Historical Provider, MD    Vital Signs: BP 119/79 (BP Location: Left Arm)   Pulse 82   Temp 97.8 F (36.6 C) (Oral)   Resp 19   SpO2 99%   Physical Exam: Abd: soft, mildly tender in LLQ, drain with tan purulent output, 20cc/24hrs. Drain site is c/d/i  Imaging: Ct Image Guided Drainage By Percutaneous Catheter  Result Date: 04/16/2016 CLINICAL DATA:  Progression of diverticular abscess. EXAM: CT GUIDED DRAINAGE OF LEFT PELVIC  ABSCESS ANESTHESIA/SEDATION: Intravenous Fentanyl and Versed were administered as conscious sedation during continuous monitoring of the patient's level of consciousness and physiological / cardiorespiratory status by the radiology RN, with a total moderate sedation time of 7 minutes. PROCEDURE: The procedure, risks, benefits, and alternatives were explained to the patient. Questions regarding the procedure were encouraged and answered. The patient understands and consents to the procedure. Select axial scans through the pelvis were obtained. The left intraperitoneal abscess was localized and an appropriate skin entry site was determined and marked. The operative field was prepped with chlorhexidinein a sterile fashion, and a sterile drape was applied covering the operative field. A sterile gown and sterile gloves were used for the procedure. Local anesthesia was provided with 1% Lidocaine. Under CT fluoroscopic guidance, an 18 gauge trocar needle was advanced into the collection. Purulent material could be aspirated. An Amplatz guidewire advanced easily within the collection, its position confirmed on CT. Tract dilated to facilitate placement of a 12 French pigtail drain catheter, formed centrally within the collection. 30 mL of purulent aspirate was sent for Gram stain, culture and sensitivity. Catheter secured externally with 0 Prolene suture and StatLock and placed to a gravity drain bag. The patient tolerated the procedure well. COMPLICATIONS: None immediate FINDINGS: Loculated gas and fluid collection in the left pelvis was again localized. 12French drain catheter placed under CT fluoroscopic guidance. A sample of the aspirate sent for Gram stain and culture. IMPRESSION: 1. Technically successful  CT-guided left pelvic abscess drain catheter placement. Electronically Signed   By: Corlis Leak M.D.   On: 04/16/2016 13:20    Labs:  CBC:  Recent Labs  04/15/16 1642 04/16/16 0258 04/17/16 1221  WBC 19.4*  17.9* 13.5*  HGB 12.4* 12.0* 12.0*  HCT 37.9* 37.5* 37.0*  PLT 303 307 323    COAGS:  Recent Labs  04/15/16 1642  INR 1.26  APTT 30    BMP:  Recent Labs  04/15/16 1642 04/16/16 0258  NA 136 136  K 4.1 3.6  CL 105 104  CO2 22 26  GLUCOSE 104* 125*  BUN 16 15  CALCIUM 9.0 8.6*  CREATININE 1.26* 1.31*  GFRNONAA >60 >60  GFRAA >60 >60    LIVER FUNCTION TESTS: No results for input(s): BILITOT, AST, ALT, ALKPHOS, PROT, ALBUMIN in the last 8760 hours.  Assessment and Plan: 1. Diverticular abscess, s/p perc drain on 8/27, [place] -cont to follow -cont routine drain care -repeat CT when output under 10-15cc a day.  If he goes home with his drain, we will arrange for drain clinic follow up.  Electronically Signed: Letha Cape 04/18/2016, 9:56 AM   I spent a total of 15 Minutes at the the patient's bedside AND on the patient's hospital floor or unit, greater than 50% of which was counseling/coordinating care for diverticular abscess

## 2016-04-19 LAB — BASIC METABOLIC PANEL
ANION GAP: 11 (ref 5–15)
BUN: 7 mg/dL (ref 6–20)
CALCIUM: 8.7 mg/dL — AB (ref 8.9–10.3)
CO2: 23 mmol/L (ref 22–32)
Chloride: 103 mmol/L (ref 101–111)
Creatinine, Ser: 0.94 mg/dL (ref 0.61–1.24)
GFR calc Af Amer: >60 mL/min (ref >60)
GFR calc non Af Amer: >60 mL/min (ref >60)
Glucose, Bld: 102 mg/dL — ABNORMAL HIGH (ref 65–99)
POTASSIUM: 4 mmol/L (ref 3.5–5.1)
Sodium: 137 mmol/L (ref 135–145)

## 2016-04-19 LAB — CBC
HEMATOCRIT: 38.4 % — AB (ref 39.0–52.0)
Hemoglobin: 12.6 g/dL — ABNORMAL LOW (ref 13.0–17.0)
MCH: 29.2 pg (ref 26.0–34.0)
MCHC: 32.8 g/dL (ref 30.0–36.0)
MCV: 88.9 fL (ref 78.0–100.0)
PLATELETS: 351 10*3/uL (ref 150–400)
RBC: 4.32 MIL/uL (ref 4.22–5.81)
RDW: 13.8 % (ref 11.5–15.5)
WBC: 11.6 10*3/uL — AB (ref 4.0–10.5)

## 2016-04-19 MED ORDER — CIPROFLOXACIN HCL 500 MG PO TABS
500.0000 mg | ORAL_TABLET | Freq: Two times a day (BID) | ORAL | Status: DC
  Administered 2016-04-19: 500 mg via ORAL
  Filled 2016-04-19: qty 1

## 2016-04-19 MED ORDER — SODIUM CHLORIDE 0.9 % IV SOLN
1.0000 g | INTRAVENOUS | Status: DC
  Administered 2016-04-19 – 2016-04-21 (×3): 1 g via INTRAVENOUS
  Filled 2016-04-19 (×3): qty 1

## 2016-04-19 MED ORDER — METRONIDAZOLE 500 MG PO TABS
500.0000 mg | ORAL_TABLET | Freq: Three times a day (TID) | ORAL | Status: DC
  Administered 2016-04-19: 500 mg via ORAL
  Filled 2016-04-19: qty 1

## 2016-04-19 NOTE — Progress Notes (Signed)
Supervising Physician: Dr. Katherina RightJay Watts  Patient Status:  Inpatient   Subjective: S/p LLQ perc drain 8/27 for diverticular abscess Feeling better daily Soreness at drain site sas improved  Allergies: Codeine  Medications:  Current Facility-Administered Medications:  .  acetaminophen (TYLENOL) tablet 650 mg, 650 mg, Oral, Q6H PRN, 650 mg at 04/18/16 0523 **OR** acetaminophen (TYLENOL) suppository 650 mg, 650 mg, Rectal, Q6H PRN, De BlanchLuke Aaron Kinsinger, MD .  dextrose 5 % and 0.45 % NaCl with KCl 20 mEq/L infusion, , Intravenous, Continuous, Rodman PickleLuke Aaron Kinsinger, MD, Last Rate: 75 mL/hr at 04/18/16 2222 .  diphenhydrAMINE (BENADRYL) 12.5 MG/5ML elixir 12.5 mg, 12.5 mg, Oral, Q6H PRN **OR** diphenhydrAMINE (BENADRYL) injection 12.5 mg, 12.5 mg, Intravenous, Q6H PRN, De BlanchLuke Aaron Kinsinger, MD .  enoxaparin (LOVENOX) injection 40 mg, 40 mg, Subcutaneous, Q24H, Elizabeth S Simaan, PA-C, 40 mg at 04/19/16 [id number] .  ertapenem (INVANZ) 1 g in sodium chloride 0.9 % 50 mL IVPB, 1 g, Intravenous, Q24H, Elizabeth S Simaan, PA-C .  famotidine (PEPCID) tablet 20 mg, 20 mg, Oral, BID, Emelia LoronMatthew Wakefield, MD, 20 mg at 04/19/16 [phone] .  MEDLINE mouth rinse, 15 mL, Mouth Rinse, BID, De BlanchLuke Aaron Kinsinger, MD, 15 mL at 04/19/16 1000 .  methocarbamol (ROBAXIN) tablet 750 mg, 750 mg, Oral, Q6H PRN, Francine GravenElizabeth S Simaan, PA-C .  morphine 2 MG/ML injection 2-4 mg, 2-4 mg, Intravenous, Q1H PRN, Rodman PickleLuke Aaron Kinsinger, MD, 2 mg at 04/16/16 2343 .  ondansetron (ZOFRAN-ODT) disintegrating tablet 4 mg, 4 mg, Oral, Q6H PRN **OR** ondansetron (ZOFRAN) injection 4 mg, 4 mg, Intravenous, Q6H PRN, Rodman PickleLuke Aaron Kinsinger, MD, 4 mg at 04/18/16 1859 .  oxyCODONE (Oxy IR/ROXICODONE) immediate release tablet 5-10 mg, 5-10 mg, Oral, Q4H PRN, Rodman PickleLuke Aaron Kinsinger, MD, 10 mg at 04/16/16 1351 .  simethicone (MYLICON) chewable tablet 40 mg, 40 mg, Oral, Q6H PRN, Rodman PickleLuke Aaron Kinsinger, MD    Vital Signs: BP 122/87 (BP Location: Left Arm)    Pulse 81   Temp 98.2 F (36.8 C) (Oral)   Resp 19   SpO2 100%   Physical Exam LLQ drain intact, site clean Output more serous with some debris/stranding  Imaging: Ct Image Guided Drainage By Percutaneous Catheter  Result Date: 04/16/2016 CLINICAL DATA:  Progression of diverticular abscess. EXAM: CT GUIDED DRAINAGE OF LEFT PELVIC ABSCESS ANESTHESIA/SEDATION: Intravenous Fentanyl and Versed were administered as conscious sedation during continuous monitoring of the patient's level of consciousness and physiological / cardiorespiratory status by the radiology RN, with a total moderate sedation time of 7 minutes. PROCEDURE: The procedure, risks, benefits, and alternatives were explained to the patient. Questions regarding the procedure were encouraged and answered. The patient understands and consents to the procedure. Select axial scans through the pelvis were obtained. The left intraperitoneal abscess was localized and an appropriate skin entry site was determined and marked. The operative field was prepped with chlorhexidinein a sterile fashion, and a sterile drape was applied covering the operative field. A sterile gown and sterile gloves were used for the procedure. Local anesthesia was provided with 1% Lidocaine. Under CT fluoroscopic guidance, an 18 gauge trocar needle was advanced into the collection. Purulent material could be aspirated. An Amplatz guidewire advanced easily within the collection, its position confirmed on CT. Tract dilated to facilitate placement of a 12 French pigtail drain catheter, formed centrally within the collection. 30 mL of purulent aspirate was sent for Gram stain, culture and sensitivity. Catheter secured externally with 0 Prolene suture and StatLock and placed to  a gravity drain bag. The patient tolerated the procedure well. COMPLICATIONS: None immediate FINDINGS: Loculated gas and fluid collection in the left pelvis was again localized. 12French drain catheter placed  under CT fluoroscopic guidance. A sample of the aspirate sent for Gram stain and culture. IMPRESSION: 1. Technically successful CT-guided left pelvic abscess drain catheter placement. Electronically Signed   By: Corlis Leak M.D.   On: 04/16/2016 13:20    Labs:  CBC:  Recent Labs  04/15/16 1642 04/16/16 0258 04/17/16 1221 04/19/16 0507  WBC 19.4* 17.9* 13.5* 11.6*  HGB 12.4* 12.0* 12.0* 12.6*  HCT 37.9* 37.5* 37.0* 38.4*  PLT 303 307 323 351    COAGS:  Recent Labs  04/15/16 1642  INR 1.26  APTT 30    BMP:  Recent Labs  04/15/16 1642 04/16/16 0258 04/19/16 0507  NA 136 136 137  K 4.1 3.6 4.0  CL 105 104 103  CO2 22 26 23   GLUCOSE 104* 125* 102*  BUN 16 15 7   CALCIUM 9.0 8.6* 8.7*  CREATININE 1.26* 1.31* 0.94  GFRNONAA >60 >60 >60  GFRAA >60 >60 >60    Assessment and Plan: LLQ diverticulitis with abscess S/p perc drain Feeling better, WBC slowly coming down Cx= ESBL E Coli  Drain care/flushes Discussed output f/u plans for CT and drain injection, we will arrange.  Electronically Signed: Brayton El 04/19/2016, 11:29 AM   I spent a total of 15 Minutes at the the patient's bedside AND on the patient's hospital floor or unit, greater than 50% of which was counseling/coordinating care for LLQ abscess drain

## 2016-04-19 NOTE — Progress Notes (Signed)
Pharmacy Antibiotic Note - CRITICAL RESULT  Patient with ESBL E Coli from abscess cx. Spoke with Hosie SpangleElizabeth Simaan of Surgery to change to ertapenem  Isaac BlissMichael Palak Tercero, PharmD, BCPS, [facility] Center[facility]CCCP Clinical Pharmacist Pager [phone] 04/19/2016 10:05 AM

## 2016-04-19 NOTE — Progress Notes (Signed)
[facility] Progress Note     Subjective: Pain improving. Tolerating clears without nausea, vomiting, or increased pain. 3 loose BMs daily. Ambulating in halls multiple times daily.  Drain ~ 25 cc/24h Objective: Vital signs in last 24 hours: Temp:  [98 F (36.7 C)-98.9 F (37.2 C)] 98.2 F (36.8 C) (08/30 0459) Pulse Rate:  [79-81] 81 (08/30 0459) Resp:  [17-19] 19 (08/30 0459) BP: (122-135)/(83-90) 122/87 (08/30 0459) SpO2:  [100 %] 100 % (08/30 0459) Last BM Date: 04/17/16  Intake/Output from previous day: 08/29 0701 - 08/30 0700 In: 2288.8 [P.O.:120; I.V.:2003.8; IV Piggyback:150] Out: 1225 [Urine:1200; Drains:25] Intake/Output this shift: No intake/output data recorded.  PE: Gen:  Alert, NAD, pleasant Card:  RRR, no M/G/R  Abd: Soft, TTP LLQ (improving), ND, +BS, drain in place with clean dressing, purulent output  Lab Results:   Recent Labs  04/17/16 1221 04/19/16 0507  WBC 13.5* 11.6*  HGB 12.0* 12.6*  HCT 37.0* 38.4*  PLT 323 351   BMET  Recent Labs  04/19/16 0507  NA 137  K 4.0  CL 103  CO2 23  GLUCOSE 102*  BUN 7  CREATININE 0.94  CALCIUM 8.7*   PT/INR No results for input(s): LABPROT, INR in the last 72 hours. CMP     Component Value Date/Time   NA 137 04/19/2016 0507   K 4.0 04/19/2016 0507   CL 103 04/19/2016 0507   CO2 23 04/19/2016 0507   GLUCOSE 102 (H) 04/19/2016 0507   BUN 7 04/19/2016 0507   CREATININE 0.94 04/19/2016 0507   CALCIUM 8.7 (L) 04/19/2016 0507   GFRNONAA >60 04/19/2016 0507   GFRAA >60 04/19/2016 0507   Lipase  No results found for: LIPASE     Studies/Results: No results found.  Anti-infectives: Anti-infectives    Start     Dose/Rate Route Frequency Ordered Stop   04/15/16 1800  cefTRIAXone (ROCEPHIN) 2 g in dextrose 5 % 50 mL IVPB  Status:  Discontinued     2 g 100 mL/hr over 30 Minutes Intravenous Every 24 hours 04/15/16 1556 04/15/16 1650   04/15/16 1700  piperacillin-tazobactam  (ZOSYN) IVPB 3.375 g     3.375 g 12.5 mL/hr over 240 Minutes Intravenous Every 8 hours 04/15/16 1655     04/15/16 1600  metroNIDAZOLE (FLAGYL) IVPB 500 mg     500 mg 100 mL/hr over 60 Minutes Intravenous Every 8 hours 04/15/16 1556       Assessment/Plan Diverticulitis, recurrent, will abscess formation S/p 12 F L pelvic abscess drain 04/16/16  - 30ml purulent aspirate for GS, C&S (Currently growing few GNR, rare gram variable rod) - WBC trending down: 11.6  GERD: Pepcid 20 mg BID   FEN: advance to full liquid diet DVT Proph: start lovenox, SCD's ID: Zosyn and Flagyl 8/26 >>  Plan:advance diet to full liquids Start PO cipro/flagyl Will need drain clinic follow-up at discharge.   LOS: 4 days    Adam PhenixElizabeth S Ulysee Fyock , [facility] 04/19/2016, 8:05 AM Pager: [phone] Consults: [phone] Mon-Fri 7:00 am-4:30 pm Sat-Sun 7:00 am-11:30 am

## 2016-04-20 HISTORY — PX: PERIPHERALLY INSERTED CENTRAL CATHETER INSERTION: SHX2221

## 2016-04-20 HISTORY — PX: ABCESS DRAINAGE: SHX399

## 2016-04-20 LAB — CBC
HCT: 38.8 % — ABNORMAL LOW (ref 39.0–52.0)
Hemoglobin: 12.8 g/dL — ABNORMAL LOW (ref 13.0–17.0)
MCH: 29.3 pg (ref 26.0–34.0)
MCHC: 33 g/dL (ref 30.0–36.0)
MCV: 88.8 fL (ref 78.0–100.0)
PLATELETS: 347 10*3/uL (ref 150–400)
RBC: 4.37 MIL/uL (ref 4.22–5.81)
RDW: 14 % (ref 11.5–15.5)
WBC: 11.6 10*3/uL — AB (ref 4.0–10.5)

## 2016-04-20 MED ORDER — SODIUM CHLORIDE 0.9% FLUSH: 10.0000 mL | Freq: Two times a day (BID) | INTRAVENOUS | Status: DC

## 2016-04-20 MED ORDER — SODIUM CHLORIDE 0.9% FLUSH
10.0000 mL | INTRAVENOUS | Status: DC | PRN
  Administered 2016-04-21: 10 mL
  Filled 2016-04-20: qty 40

## 2016-04-20 NOTE — Progress Notes (Signed)
[facility]  Patient Status: New pt for [facility] this admission  [facility] is providing the following services: HHRN and Home Infusion Pharmacy services for home IV ABX.  [facility] hospital infusion coordinator will provide hands on teaching prior to DC to support independence at home with IV ABX administration.  [facility] hospital team will follow Dr. Colin Rhein until DC to ensure [facility] and infusion needs are met.  If patient discharges after hours, please call [phone].   Norman Reyes 04/20/2016, 10:49 AM

## 2016-04-20 NOTE — Progress Notes (Signed)
[facility] Progress Note     Subjective: Tolerating full liquids without pain or nausea. Does have early satiety. Ambulating. ambdominal pain improving. Loose brown BMs. +flatus.   Objective: Vital signs in last 24 hours: Temp:  [98.4 F (36.9 C)-98.6 F (37 C)] 98.6 F (37 C) (08/31 0554) Pulse Rate:  [75-84] 75 (08/31 0554) Resp:  [18] 18 (08/31 0554) BP: (113-125)/(74-84) 115/74 (08/31 0554) SpO2:  [98 %-99 %] 99 % (08/31 0554) Last BM Date: 04/19/16  Intake/Output from previous day: 08/30 0701 - 08/31 0700 In: 1968.8 [P.O.:240; I.V.:1723.8] Out: 1710 [Urine:1700; Drains:10] Intake/Output this shift: No intake/output data recorded.  PE: Gen:  Alert, NAD, pleasant Card:  RRR, no M/G/R heard Pulm:  CTA, no W/R/R Abd: Soft, mildy TTP LLQ, ND, +BS, LLQ with clean dressing in place, minimal output  Lab Results:   Recent Labs  04/17/16 1221 04/19/16 0507  WBC 13.5* 11.6*  HGB 12.0* 12.6*  HCT 37.0* 38.4*  PLT 323 351   BMET  Recent Labs  04/19/16 0507  NA 137  K 4.0  CL 103  CO2 23  GLUCOSE 102*  BUN 7  CREATININE 0.94  CALCIUM 8.7*   PT/INR No results for input(s): LABPROT, INR in the last 72 hours. CMP     Component Value Date/Time   NA 137 04/19/2016 0507   K 4.0 04/19/2016 0507   CL 103 04/19/2016 0507   CO2 23 04/19/2016 0507   GLUCOSE 102 (H) 04/19/2016 0507   BUN 7 04/19/2016 0507   CREATININE 0.94 04/19/2016 0507   CALCIUM 8.7 (L) 04/19/2016 0507   GFRNONAA >60 04/19/2016 0507   GFRAA >60 04/19/2016 0507   Lipase  No results found for: LIPASE  Studies/Results: No results found.  Anti-infectives: Anti-infectives    Start     Dose/Rate Route Frequency Ordered Stop   04/19/16 1100  ertapenem (INVANZ) 1 g in sodium chloride 0.9 % 50 mL IVPB     1 g 100 mL/hr over 30 Minutes Intravenous Every 24 hours 04/19/16 1004     04/19/16 0830  ciprofloxacin (CIPRO) tablet 500 mg  Status:  Discontinued     500 mg Oral 2 times  daily 04/19/16 0821 04/19/16 1004   04/19/16 0830  metroNIDAZOLE (FLAGYL) tablet 500 mg  Status:  Discontinued     500 mg Oral Every 8 hours 04/19/16 0821 04/19/16 1004   04/15/16 1800  cefTRIAXone (ROCEPHIN) 2 g in dextrose 5 % 50 mL IVPB  Status:  Discontinued     2 g 100 mL/hr over 30 Minutes Intravenous Every 24 hours 04/15/16 1556 04/15/16 1650   04/15/16 1700  piperacillin-tazobactam (ZOSYN) IVPB 3.375 g  Status:  Discontinued     3.375 g 12.5 mL/hr over 240 Minutes Intravenous Every 8 hours 04/15/16 1655 04/19/16 0821   04/15/16 1600  metroNIDAZOLE (FLAGYL) IVPB 500 mg  Status:  Discontinued     500 mg 100 mL/hr over 60 Minutes Intravenous Every 8 hours 04/15/16 1556 04/19/16 [id number]     Assessment/Plan  Patient with ESBL E Coli from abscess  Diverticulitis, recurrent, will abscess formation S/p 12 F L pelvic abscess drain 04/16/16  - 30ml purulent aspirate for GS, C&S (positive for ESBL E Coli) - ID consult: ESBL E Coli resistant to everything except ertapenem/imipenem/pip-tazo in a patient treated prior to this admission with 7 days of triple-IV abx at [place] (Zyvox, Zosyn, Primaxin) and 24 hours on an oral fluoroquinolone. LLQ drain in place, WBC trending down. Spoke  with Dr. Orvan Falconer in ID and will proceed with PICC placement and 2-3 weeks of IV ertapenem. Duration of therapy will depend on drain output, follow-up CT results, and clinical improvement.   GERD: Pepcid 20 mg BID   FEN: advance to full liquid diet DVT Proph: start lovenox, SCD's ID: Zosyn and Flagyl 8/26 >>  Plan:continue full liquids, PICC placement Discharge on abx this afternoon vs tomorrow.   LOS: 5 days    Adam Phenix , [facility] 04/20/2016, 7:34 AM Pager: [phone] Consults: [phone] Mon-Fri 7:00 am-4:30 pm Sat-Sun 7:00 am-11:30 am

## 2016-04-20 NOTE — Progress Notes (Signed)
Referring Physician(s): Dr Feliciana RossettiLuke Kinsinger  Supervising Physician: Gilmer MorWagner, Jaime  Patient Status:  Inpatient  Chief Complaint:  LLQ diverticular abscess  Subjective:  8/27 note: CT placement 32F L pelvic abscess drain 30ml purulent aspirate for GS, C&S  Doing well Output serous color Denies pain Better daily  Allergies: Codeine  Medications: Prior to Admission medications   Medication Sig Start Date End Date Taking? Authorizing Provider  acetaminophen (TYLENOL) 500 MG tablet Take 500 mg by mouth every 8 (eight) hours as needed (pain).   Yes Historical Provider, MD  allopurinol (ZYLOPRIM) 300 MG tablet Take 300 mg by mouth at bedtime.   Yes Historical Provider, MD  ibuprofen (ADVIL,MOTRIN) 200 MG tablet Take 600 mg by mouth every 8 (eight) hours as needed (pain).   Yes Historical Provider, MD  levofloxacin (LEVAQUIN) 750 MG tablet Take 750 mg by mouth every evening. 10 day course started 04/13/16   Yes Historical Provider, MD  losartan (COZAAR) 100 MG tablet Take 100 mg by mouth daily.   Yes Historical Provider, MD  metFORMIN (GLUCOPHAGE-XR) 500 MG 24 hr tablet Take 500 mg by mouth 2 (two) times daily.   Yes Historical Provider, MD  metroNIDAZOLE (FLAGYL) 500 MG tablet Take 500 mg by mouth 3 (three) times daily. 10 day course started 04/12/16   Yes Historical Provider, MD  ranitidine (ZANTAC) 150 MG tablet Take 150 mg by mouth daily.   Yes Historical Provider, MD  simvastatin (ZOCOR) 40 MG tablet Take 40 mg by mouth at bedtime.   Yes Historical Provider, MD     Vital Signs: BP 115/74 (BP Location: Left Arm)   Pulse 75   Temp 98.6 F (37 C) (Oral)   Resp 18   SpO2 99%   Physical Exam  Constitutional: He is oriented to person, place, and time.  Abdominal: Soft. Bowel sounds are normal. There is no tenderness.  Neurological: He is alert and oriented to person, place, and time.  Skin: Skin is warm and dry.  LLQ abscess drain site is clean and dry NT No  bleeding Output 10 cc yesterday Minimal in bag Wbc 11.6 Cx: ecoli afeb   Nursing note and vitals reviewed.   Imaging: Ct Image Guided Drainage By Percutaneous Catheter  Result Date: 04/16/2016 CLINICAL DATA:  Progression of diverticular abscess. EXAM: CT GUIDED DRAINAGE OF LEFT PELVIC ABSCESS ANESTHESIA/SEDATION: Intravenous Fentanyl and Versed were administered as conscious sedation during continuous monitoring of the patient's level of consciousness and physiological / cardiorespiratory status by the radiology RN, with a total moderate sedation time of 7 minutes. PROCEDURE: The procedure, risks, benefits, and alternatives were explained to the patient. Questions regarding the procedure were encouraged and answered. The patient understands and consents to the procedure. Select axial scans through the pelvis were obtained. The left intraperitoneal abscess was localized and an appropriate skin entry site was determined and marked. The operative field was prepped with chlorhexidinein a sterile fashion, and a sterile drape was applied covering the operative field. A sterile gown and sterile gloves were used for the procedure. Local anesthesia was provided with 1% Lidocaine. Under CT fluoroscopic guidance, an 18 gauge trocar needle was advanced into the collection. Purulent material could be aspirated. An Amplatz guidewire advanced easily within the collection, its position confirmed on CT. Tract dilated to facilitate placement of a 12 French pigtail drain catheter, formed centrally within the collection. 30 mL of purulent aspirate was sent for Gram stain, culture and sensitivity. Catheter secured externally with 0 Prolene suture  and StatLock and placed to a gravity drain bag. The patient tolerated the procedure well. COMPLICATIONS: None immediate FINDINGS: Loculated gas and fluid collection in the left pelvis was again localized. 12French drain catheter placed under CT fluoroscopic guidance. A sample of  the aspirate sent for Gram stain and culture. IMPRESSION: 1. Technically successful CT-guided left pelvic abscess drain catheter placement. Electronically Signed   By: Corlis Leak M.D.   On: 04/16/2016 13:20    Labs:  CBC:  Recent Labs  04/16/16 0258 04/17/16 1221 04/19/16 0507 04/20/16 0824  WBC 17.9* 13.5* 11.6* 11.6*  HGB 12.0* 12.0* 12.6* 12.8*  HCT 37.5* 37.0* 38.4* 38.8*  PLT 307 323 351 347    COAGS:  Recent Labs  04/15/16 1642  INR 1.26  APTT 30    BMP:  Recent Labs  04/15/16 1642 04/16/16 0258 04/19/16 0507  NA 136 136 137  K 4.1 3.6 4.0  CL 105 104 103  CO2 22 26 23   GLUCOSE 104* 125* 102*  BUN 16 15 7   CALCIUM 9.0 8.6* 8.7*  CREATININE 1.26* 1.31* 0.94  GFRNONAA >60 >60 >60  GFRAA >60 >60 >60    LIVER FUNCTION TESTS: No results for input(s): BILITOT, AST, ALT, ALKPHOS, PROT, ALBUMIN in the last 8760 hours.  Assessment and Plan:  Diverticular abscess LLQ Drain placed 8/27 Will follow Will need reCt when output less than 10 cc daily Can follow in IR clinic  Electronically Signed: Vashawn Ekstein A 04/20/2016, 11:08 AM   I spent a total of 15 Minutes at the the patient's bedside AND on the patient's hospital floor or unit, greater than 50% of which was counseling/coordinating care for divertic abscess

## 2016-04-20 NOTE — Progress Notes (Signed)
Pt requested all items to do oral care and bath. Tech offered assistance, Pt stated he can do bath.

## 2016-04-20 NOTE — Care Management Note (Signed)
Case Management Note  Patient Details  Name: Norman Reyes MRN: [id number] Date of Birth: 08/10/1971  Subjective/Objective:                    Action/Plan:   Expected Discharge Date:                  Expected Discharge Plan:  Home w Home Health Services  In-House Referral:     Discharge planning Services  CM Consult  Post Acute Care Choice:  Home Health Choice offered to:  Patient  DME Arranged:    DME Agency:     HH Arranged:  RN, IV Antibiotics HH Agency:  [facility]  Status of Service:  Completed, signed off  If discussed at Long Length of Stay Meetings, dates discussed:    Additional Comments:  Norman Reyes, Norman Olberding Marie, RN 04/20/2016, 10:23 AM

## 2016-04-20 NOTE — Progress Notes (Signed)
Peripherally Inserted Central Catheter/Midline Placement  The IV Nurse has discussed with the patient and/or persons authorized to consent for the patient, the purpose of this procedure and the potential benefits and risks involved with this procedure.  The benefits include less needle sticks, lab draws from the catheter, ability to peform PICC exchange if ordered by the physician and patient may be discharged home with the catheter.  Risks include, but not limited to, infection, bleeding, blood clot (thrombus formation), and puncture of an artery; nerve damage and irregular heat beat.  Alternatives to this procedure were also discussed.  Bard educational packet given topt.  PICC/Midline Placement Documentation  PICC Single Lumen 04/20/16 PICC Right Basilic 41 cm 1 cm (Active)  Indication for Insertion or Continuance of Line Home intravenous therapies (PICC only) 04/20/2016  8:02 PM  Exposed Catheter (cm) 1 cm 04/20/2016  8:02 PM  Site Assessment Clean;Dry;Intact 04/20/2016  8:02 PM  Line Status Flushed;Saline locked;Blood return noted 04/20/2016  8:02 PM  Dressing Type Transparent 04/20/2016  8:02 PM  Dressing Status Clean;Dry;Intact;Antimicrobial disc in place 04/20/2016  8:02 PM  Line Care Connections checked and tightened 04/20/2016  8:02 PM  Line Adjustment (NICU/IV Team Only) No 04/20/2016  8:02 PM  Dressing Intervention New dressing 04/20/2016  8:02 PM  Dressing Change Due 04/27/16 04/20/2016  8:02 PM       Elliot Dallyiggs, Bobby Barton Wright 04/20/2016, 8:03 PM

## 2016-04-21 ENCOUNTER — Encounter (HOSPITAL_COMMUNITY): Payer: Self-pay | Admitting: General Practice

## 2016-04-21 LAB — AEROBIC/ANAEROBIC CULTURE W GRAM STAIN (SURGICAL/DEEP WOUND)

## 2016-04-21 MED ORDER — SODIUM CHLORIDE 0.9 % IV SOLN: 1.0000 g | INTRAVENOUS | 0 refills | Status: AC

## 2016-04-21 MED ORDER — HEPARIN SOD (PORK) LOCK FLUSH 100 UNIT/ML IV SOLN
250.0000 [IU] | INTRAVENOUS | Status: AC | PRN
  Administered 2016-04-21: 250 [IU]

## 2016-04-21 NOTE — Discharge Summary (Signed)
[facility] Discharge Summary   Patient ID: Norman Reyes MRN: [id number] DOB/AGE: 09-23-1970 45 y.o.  Admit date: 04/15/2016 Discharge date: 04/21/2016  Admitting Diagnosis: Colonic diverticular abscess Diverticulitis, recurrent  Discharge Diagnosis Patient Active Problem List   Diagnosis Date Noted  . Colonic diverticular abscess 04/15/2016   Consultants None   Imaging: No results found.  Procedures Dr. Oley Balm (04/16/16) - placement 25F left pelvic abscess drain; 30 cc purulent aspirate sent for culture  Hospital Course:  45 y/o male who presented to [facility] with recurring LLQ abdominal pain and nausea after being admitted to [facility] 10 days ago for diverticulitis with 3 cm absess and treated with IV antibiotics and discharged.  Workup in ED significant for 7 cm abscess on CT. Patient was admitted and underwent procedure listed above.  Tolerated procedure well and was transferred to the floor.  Diet was advanced as abdominal pain improved and his drain output was monitied. Drain culture came back positive for ESBL E. Coli and ertapenem was initiatied. ID was consulted to confirm there was no oral treatment regimen appropriate for the patient. A PICC line was placed so the patient could continue ertapenem at home. On hospital day #6, the patient was voiding well, tolerating diet, ambulating well, pain well controlled, vital signs stable, and felt stable for discharge. He will receive once daily infusions of ertapenem at home for 3 weeks and will follow-up in our office with Dr. Sheliah Hatch in 2-3 weeks. His drain will be removed by IR in about 10 days. He should receive a colonoscopy by GI in 4-6 weeks.  Physical Exam: General:  Alert, NAD, pleasant, comfortable Abd:  Soft, ND, mild tenderness, LLQ drain in place with <5cc output    Medication List    STOP taking these medications   levofloxacin 750 MG tablet Commonly known as:  LEVAQUIN   metroNIDAZOLE  500 MG tablet Commonly known as:  FLAGYL     TAKE these medications   acetaminophen 500 MG tablet Commonly known as:  TYLENOL Take 500 mg by mouth every 8 (eight) hours as needed (pain).   allopurinol 300 MG tablet Commonly known as:  ZYLOPRIM Take 300 mg by mouth at bedtime.   ertapenem 1 g in sodium chloride 0.9 % 50 mL Inject 1 g into the vein daily.   ibuprofen 200 MG tablet Commonly known as:  ADVIL,MOTRIN Take 600 mg by mouth every 8 (eight) hours as needed (pain).   losartan 100 MG tablet Commonly known as:  COZAAR Take 100 mg by mouth daily.   metFORMIN 500 MG 24 hr tablet Commonly known as:  GLUCOPHAGE-XR Take 500 mg by mouth 2 (two) times daily.   ranitidine 150 MG tablet Commonly known as:  ZANTAC Take 150 mg by mouth daily.   simvastatin 40 MG tablet Commonly known as:  ZOCOR Take 40 mg by mouth at bedtime.        Follow-up Information    HASSELL III, DAYNE DANIEL, MD Follow up in 10 day(s).   Specialty:  Interventional Radiology Why:  IR drain clinic will call pt for time and date of follow up; [phone] Contact information: [address] [place] [address] [phone]        Rodman Pickle, MD. Schedule an appointment as soon as possible for a visit in 2 week(s).   Specialty:  General Surgery Why:  for a visit in weeks for follow-up from your recent hospitalization and consultation regarding possible surgical treatment  of your recurrent diverticulitis. Contact information: [address] [place] [address] [phone]           Signed: Hosie Spanglelizabeth Rajvir Ernster, [employer] [facility] 04/21/2016, 12:52 PM Pager: [phone] Consults: [phone] Mon-Fri 7:00 am-4:30 pm Sat-Sun 7:00 am-11:30 am

## 2016-04-21 NOTE — Care Management Note (Addendum)
Case Management Note  Patient Details  Name: Norman HubertMarcus Buening MRN: [id number] Date of Birth: 03/06/1971  Subjective/Objective:                    Action/Plan:  1350 DC summary [facility]ed [facility]  [facility] .   [facility] can provide [employer] Services , however, are unable to [facility]. [facility] would like [facility] . Spoke to Barnabas ListerDonna Hayes at same phone [phone] fax [phone] , [facility] and IV infusion faxed to Ms Madilyn FiremanHayes who accepted referral   Discharge summary will need to be faxed once completed.  Expected Discharge Date:                  Expected Discharge Plan:  Home w Home Health Services  In-House Referral:     Discharge planning Services  CM Consult  Post Acute Care Choice:  Home Health Choice offered to:  Patient  DME Arranged:    DME Agency:     HH Arranged:  RN, IV Antibiotics HH [facility]  [facility]  Status of Service:  Completed, signed off  If discussed at Long Length of Stay Meetings, dates discussed:    Additional Comments:  Kingsley PlanWile, Dontasia Miranda Marie, RN 04/21/2016, 12:12 PM

## 2016-04-21 NOTE — Progress Notes (Signed)
Jeanmarie HubertMarcus Pena to be D/C'd  per MD order. Discussed with the patient and all questions fully answered.  VSS, Skin clean, dry and intact without evidence of skin break down, no evidence of skin tears noted.    An After Visit Summary was printed and given to the patient. Patient received prescription.  D/c education completed with patient/family including follow up instructions, medication list, d/c activities limitations if indicated, with other d/c instructions as indicated by MD - patient able to verbalize understanding, all questions fully answered.   Patient instructed to return to ED, call 911, or call MD for any changes in condition.   Patient to be escorted via WC, and D/C home via private auto.

## 2016-04-21 NOTE — Discharge Instructions (Signed)
ERTAPENEM - ONCE DAILY INFUSION OVER 30 MINUTES, PER HOME HEALTH.  FLUSH PERCUTANEOUS DRAIN ONCE DAILY   KEEP DRAIN COVERED, CLEAN, and DRY.

## 2016-04-25 ENCOUNTER — Other Ambulatory Visit: Payer: Self-pay | Admitting: General Surgery

## 2016-04-25 DIAGNOSIS — K5720 Diverticulitis of large intestine with perforation and abscess without bleeding: Secondary | ICD-10-CM

## 2016-05-02 ENCOUNTER — Ambulatory Visit
Admission: RE | Admit: 2016-05-02 | Discharge: 2016-05-02 | Disposition: A | Discharge status: Home / Self Care | Payer: BLUE CROSS/BLUE SHIELD | Source: Ambulatory Visit | Attending: General Surgery | Admitting: General Surgery

## 2016-05-02 ENCOUNTER — Ambulatory Visit
Admission: RE | Admit: 2016-05-02 | Discharge: 2016-05-02 | Disposition: A | Discharge status: Home / Self Care | Payer: BLUE CROSS/BLUE SHIELD | Source: Ambulatory Visit | Attending: Radiology | Admitting: Radiology

## 2016-05-02 DIAGNOSIS — K5720 Diverticulitis of large intestine with perforation and abscess without bleeding: Secondary | ICD-10-CM

## 2016-05-02 HISTORY — PX: IR GENERIC HISTORICAL: IMG1180011

## 2016-05-02 MED ORDER — IOPAMIDOL (ISOVUE-300) INJECTION 61%
100.0000 mL | Freq: Once | INTRAVENOUS | Status: AC | PRN
  Administered 2016-05-02: 100 mL via INTRAVENOUS

## 2016-05-02 NOTE — Progress Notes (Signed)
Patient ID: Norman Reyes, male   DOB: [birth year], 45 y.o.   MRN: [id number]   Referring Physician(s): Dr. Feliciana Rossetti  Chief Complaint: The patient is seen in follow up today s/p drain placement by Dr. Deanne Coffer on 04/16/16.  History of present illness:  This is a 45 yo male who was admitted to [facility] with diverticulitis and a 3cm abscess.  He was treated there for abotu 10 days and then discharged.  He continued to feel poorly and presented to [facility] on 04-15-16.  He had a CT scan that revealed a 7cm abscess.  He was admitted and a percutaneous drain was placed on the following day.  He was discharged with his drain and on IV Invanz.  He has another week and a half of abx therapy left.  He is flushing his drain with 5cc of NS daily.  His output was initially tan purulent, but did change to serous.  However, about 5 days ago it changed back to tan purulent appearing.  It puts out about 5-10cc a day.  Past Medical History:  Diagnosis Date  . Diabetes mellitus without complication (HCC)    type 2  . Diverticulitis   . 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 GERD (gastroesophageal reflux disease)   . Gout   . Hyperlipidemia   . Hypertension     Past Surgical History:  Procedure Laterality Date  . ABCESS DRAINAGE  04/20/2016  . KNEE SURGERY    . PERIPHERALLY INSERTED CENTRAL CATHETER INSERTION  04/20/2016    Allergies: Codeine  Medications: Prior to Admission medications   Medication Sig Start Date End Date Taking? Authorizing Provider  acetaminophen (TYLENOL) 500 MG tablet Take 500 mg by mouth every 8 (eight) hours as needed (pain).    Historical Provider, MD  allopurinol (ZYLOPRIM) 300 MG tablet Take 300 mg by mouth at bedtime.    Historical Provider, MD  ertapenem 1 g in sodium chloride 0.9 % 50 mL Inject 1 g into the vein daily. 04/21/16   Francine Graven Simaan, PA-C  ibuprofen (ADVIL,MOTRIN) 200 MG tablet Take 600 mg by mouth every 8 (eight)  hours as needed (pain).    Historical Provider, MD  losartan (COZAAR) 100 MG tablet Take 100 mg by mouth daily.    Historical Provider, MD  metFORMIN (GLUCOPHAGE-XR) 500 MG 24 hr tablet Take 500 mg by mouth 2 (two) times daily.    Historical Provider, MD  ranitidine (ZANTAC) 150 MG tablet Take 150 mg by mouth daily.    Historical Provider, MD  simvastatin (ZOCOR) 40 MG tablet Take 40 mg by mouth at bedtime.    Historical Provider, MD     No family history on file.  Social History   Social History  . Marital status: Single    Spouse name: N/A  . Number of children: N/A  . Years of education: N/A   Social History Main Topics  . Smoking status: Never Smoker  . Smokeless tobacco: Never Used  . Alcohol use No  . Drug use: No  . Sexual activity: Not on file   Other Topics Concern  . Not on file   Social History Narrative  . No narrative on file     Vital Signs: (F)   97.8  97.8 (36.6)  09/12 1100  Pulse Rate   81  81  09/12 1100  BP   112/77  112/77  09/12 1100  SpO2 (%)   99  99  09/12 1100  Physical Exam Gen: NAD Abd: soft, NT, ND, drain present in LLQ.  Drain site is c/d/i/  Drain with minimal tan, purulent appearing output.  There are small flecks of feculent material in the bag.  Imaging: No results found.  Labs:  CBC:  Recent Labs  04/16/16 0258 04/17/16 1221 04/19/16 0507 04/20/16 0824  WBC 17.9* 13.5* 11.6* 11.6*  HGB 12.0* 12.0* 12.6* 12.8*  HCT 37.5* 37.0* 38.4* 38.8*  PLT 307 323 351 347    COAGS:  Recent Labs  04/15/16 1642  INR 1.26  APTT 30    BMP:  Recent Labs  04/15/16 1642 04/16/16 0258 04/19/16 0507  NA 136 136 137  K 4.1 3.6 4.0  CL 105 104 103  CO2 22 26 23   GLUCOSE 104* 125* 102*  BUN 16 15 7   CALCIUM 9.0 8.6* 8.7*  CREATININE 1.26* 1.31* 0.94  GFRNONAA >60 >60 >60  GFRAA >60 >60 >60    LIVER FUNCTION TESTS: No results for input(s): BILITOT, AST, ALT, ALKPHOS, PROT, ALBUMIN in the last 8760  hours.  Assessment:  1. Diverticulitis with abscess, s/p perc drain with a fistula -Repeat CT scan today shows resolution of his abscess; however, his drain injection reveals a fistulous connection.  We will leave his drain in place for now and cease his drain flushes to hopefully heal this fistula.  He will continue his antibiotic therapy as prescribed by his surgeon.  We will have him return to clinic in 2 weeks for a repeat drain injection.  No other CT scan will be needed at this time.  Patient understands all of this and agrees to the plan.  Signed: Letha CapeOSBORNE,Laporchia Nakajima E 05/02/2016, 11:05 AM   Please refer to Dr. Chester HolsteinShick's attestation of this note for management and plan.

## 2016-05-03 ENCOUNTER — Other Ambulatory Visit (HOSPITAL_COMMUNITY): Payer: Self-pay | Admitting: Interventional Radiology

## 2016-05-03 DIAGNOSIS — K5720 Diverticulitis of large intestine with perforation and abscess without bleeding: Secondary | ICD-10-CM

## 2016-05-04 ENCOUNTER — Other Ambulatory Visit: Payer: BLUE CROSS/BLUE SHIELD

## 2016-05-10 ENCOUNTER — Other Ambulatory Visit: Payer: BLUE CROSS/BLUE SHIELD

## 2016-05-12 ENCOUNTER — Encounter (HOSPITAL_COMMUNITY): Payer: Self-pay | Admitting: Interventional Radiology

## 2016-05-12 ENCOUNTER — Ambulatory Visit (HOSPITAL_COMMUNITY)
Admission: RE | Admit: 2016-05-12 | Discharge: 2016-05-12 | Disposition: A | Discharge status: Home / Self Care | Payer: BLUE CROSS/BLUE SHIELD | Source: Ambulatory Visit | Attending: Interventional Radiology | Admitting: Interventional Radiology

## 2016-05-12 DIAGNOSIS — K632 Fistula of intestine: Secondary | ICD-10-CM | POA: Insufficient documentation

## 2016-05-12 DIAGNOSIS — Z4803 Encounter for change or removal of drains: Secondary | ICD-10-CM | POA: Insufficient documentation

## 2016-05-12 DIAGNOSIS — K5720 Diverticulitis of large intestine with perforation and abscess without bleeding: Secondary | ICD-10-CM

## 2016-05-12 HISTORY — PX: IR GENERIC HISTORICAL: IMG1180011

## 2016-05-12 MED ORDER — IOPAMIDOL (ISOVUE-300) INJECTION 61%
5.0000 mL | Freq: Once | INTRAVENOUS | Status: AC | PRN
Start: 2016-05-12 — End: 2016-05-12
  Administered 2016-05-12: 5 mL

## 2016-05-16 ENCOUNTER — Other Ambulatory Visit: Payer: BLUE CROSS/BLUE SHIELD

## 2016-08-24 ENCOUNTER — Encounter: Payer: Self-pay | Admitting: Interventional Radiology

## 2017-05-24 IMAGING — RF DG SINUS / FISTULA TRACT / ABSCESSOGRAM
5 series · 6 of 6 positions shown · IV contrast (omnipaque)
Comparison: 05/02/2016

CLINICAL DATA: Left lower quadrant pelvic diverticular abscess,
status post percutaneous drainage.

EXAM:
ABSCESS INJECTION
CONTRAST:  5 cc Omnipaque 300
FLUOROSCOPY TIME:  Fluoroscopy Time:  42 seconds
Radiation Exposure Index (if provided by the fluoroscopic device):
53 dRycm8
Number of Acquired Spot Images: 5

[Series 1: run · 1 of 1 slices shown (1 of 5)]
[im 1/1]
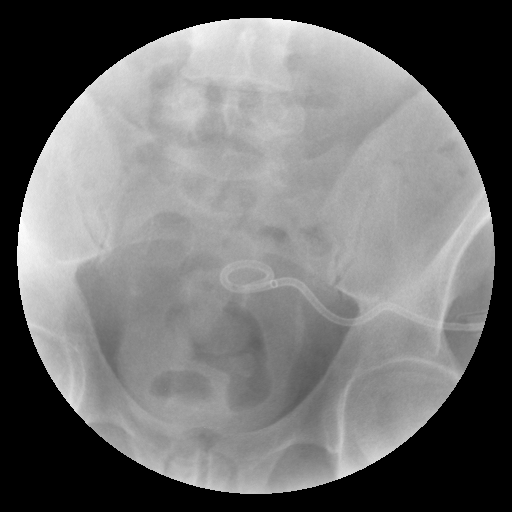

[Series 2: run · 1 of 1 slices shown (2 of 5)]
[im 1/1]
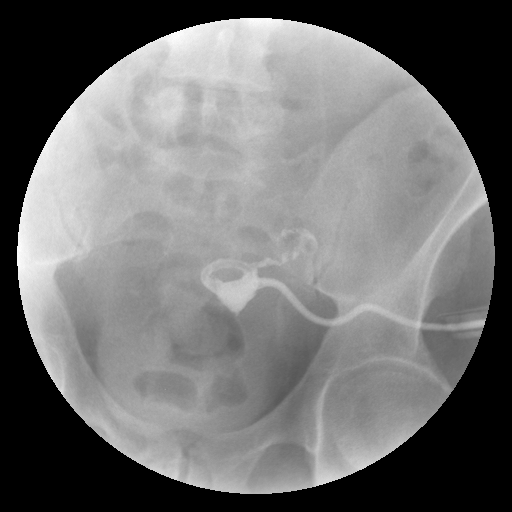

[Series 3: run · 2 of 2 slices shown (3 of 5)]
[im 1/2]
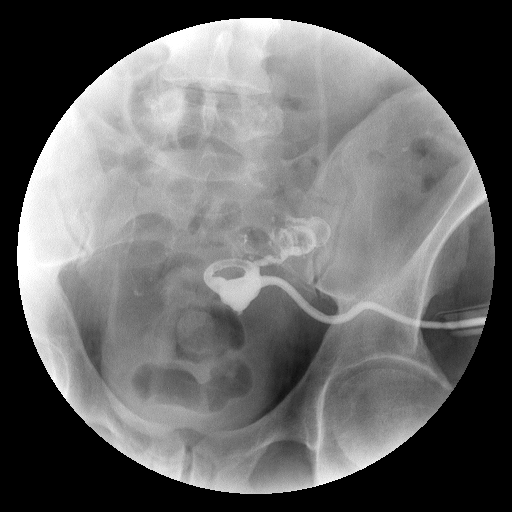
[im 2/2]
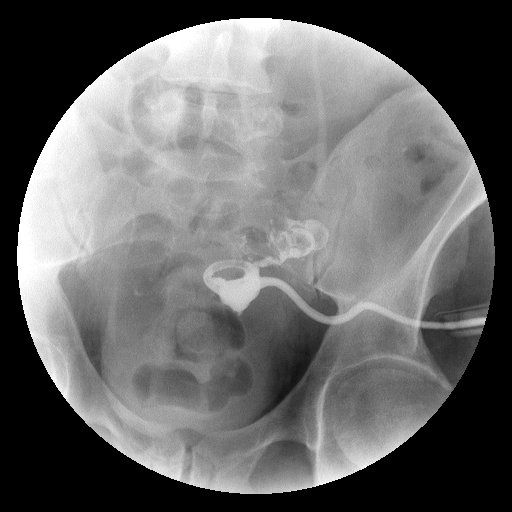

[Series 4: run · 1 of 1 slices shown (4 of 5)]
[im 1/1]
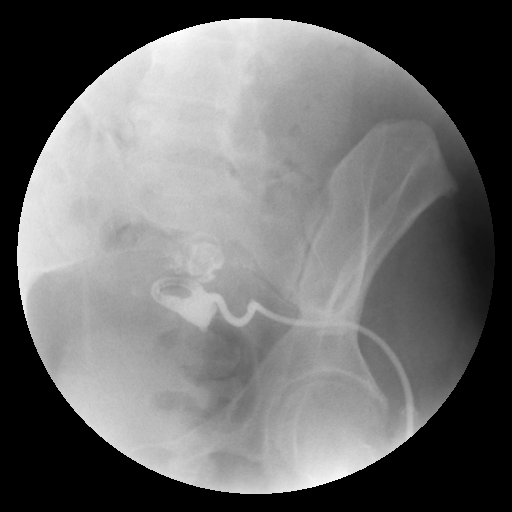

[Series 5: run · 1 of 1 slices shown (5 of 5)]
[im 1/1]
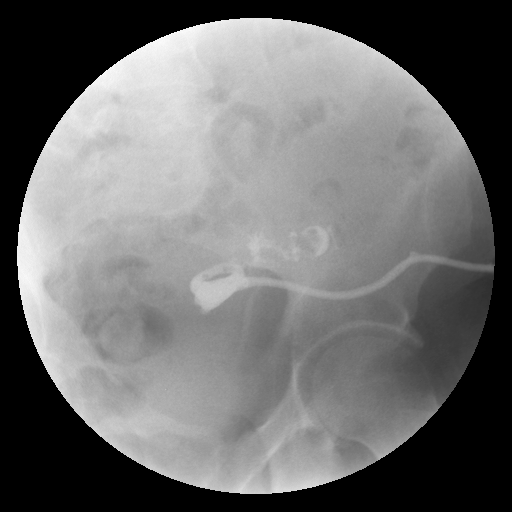

[6 of 6 positions shown; findings below may reference images not displayed]

FINDINGS: Contrast injection performed of the left lower quadrant abscess
drain. This demonstrates a small patent fistula from the collapsed
abscess cavity to the adjacent sigmoid colon.
IMPRESSION: Patent fistula from the collapsed abscess cavity drain site to the
adjacent sigmoid.
